# Patient Record
Sex: Female | Born: 1995 | Race: White | Hispanic: No | Marital: Single | Smoking: Never smoker
Health system: Southern US, Community
[De-identification: ages and names within clinical notes are randomized; demographics above are authoritative.]

## PROBLEM LIST (undated history)

## (undated) DIAGNOSIS — N920 Excessive and frequent menstruation with regular cycle: Secondary | ICD-10-CM

## (undated) DIAGNOSIS — G43909 Migraine, unspecified, not intractable, without status migrainosus: Secondary | ICD-10-CM

## (undated) DIAGNOSIS — R519 Headache, unspecified: Secondary | ICD-10-CM

## (undated) HISTORY — DX: Excessive and frequent menstruation with regular cycle: N92.0

## (undated) HISTORY — DX: Headache, unspecified: R51.9

## (undated) HISTORY — PX: EYE SURGERY: SHX253

## (undated) HISTORY — PX: OTHER SURGICAL HISTORY: SHX169

## (undated) HISTORY — DX: Migraine, unspecified, not intractable, without status migrainosus: G43.909

---
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
## 2016-06-08 IMAGING — MR MR WRIST*L* W/O CM
4 of 6 series · 13 of 40 positions shown · non-contrast
Comparison: None.

CLINICAL DATA: Left wrist pain and popping since an injury
approximately 1 year ago while performing karate.

EXAM:
MR OF THE LEFT WRIST WITHOUT CONTRAST
TECHNIQUE: Multiplanar, multisequence MR imaging of the left wrist was
performed. No intravenous contrast was administered.

[Series 3: t2fs axial · axial · 3.0mm · 0.14mm/px · z∈[-64,-13]mm · 3 of 24 slices shown]
[im 4/24]
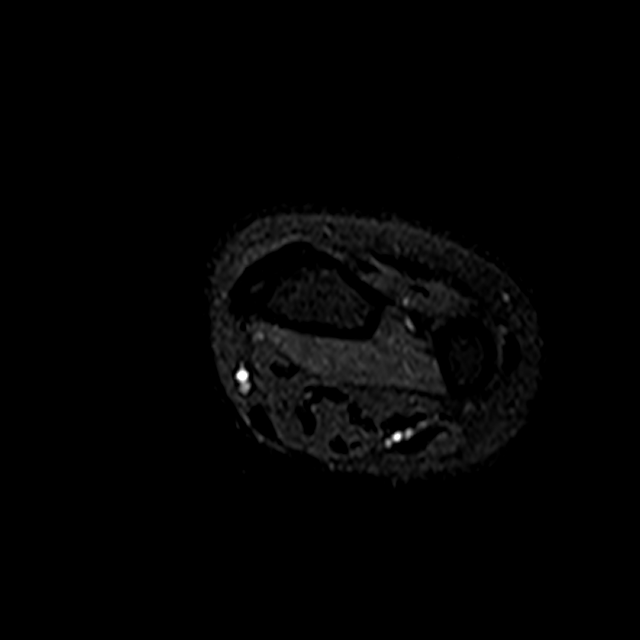
[im 12/24]
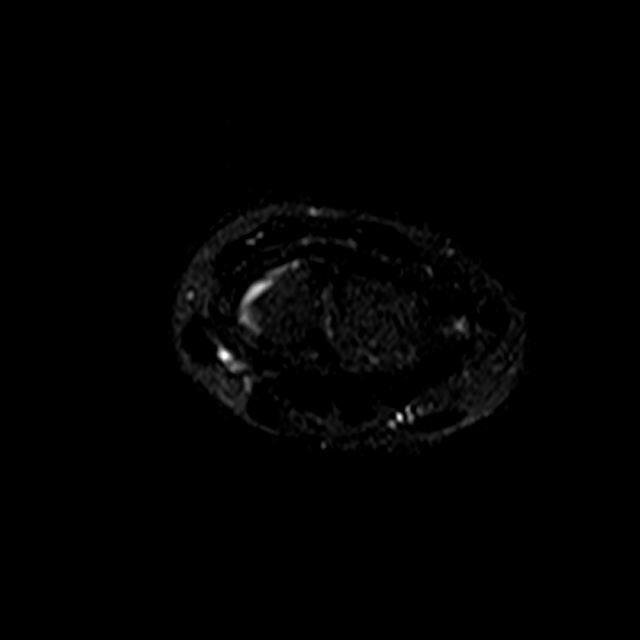
[im 20/24]
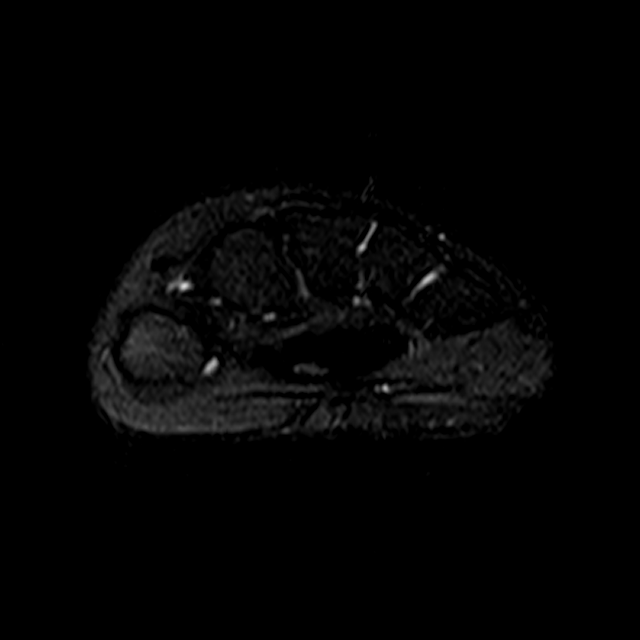

[Series 4: T1 · coronal · 3.0mm · 0.14mm/px · 4 of 16 slices shown]
[im 1/16]
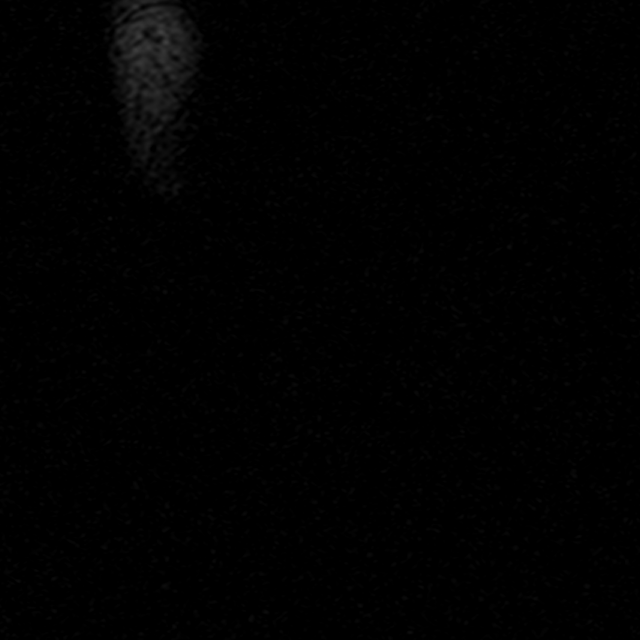
[im 4/16]
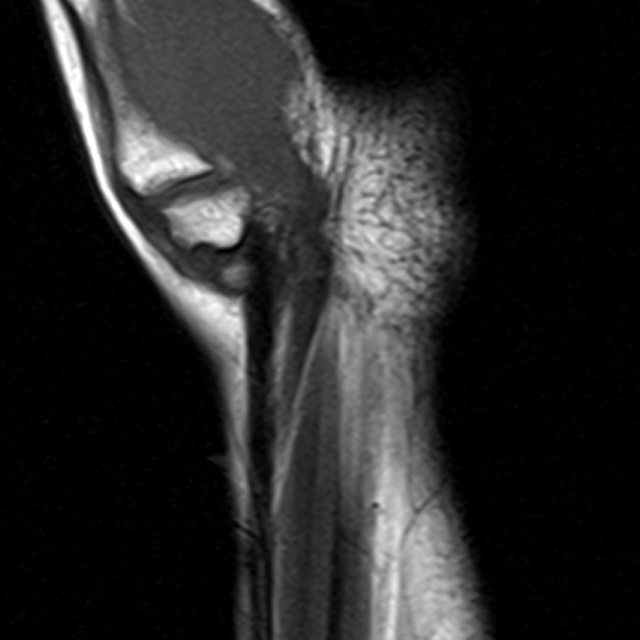
[im 10/16]
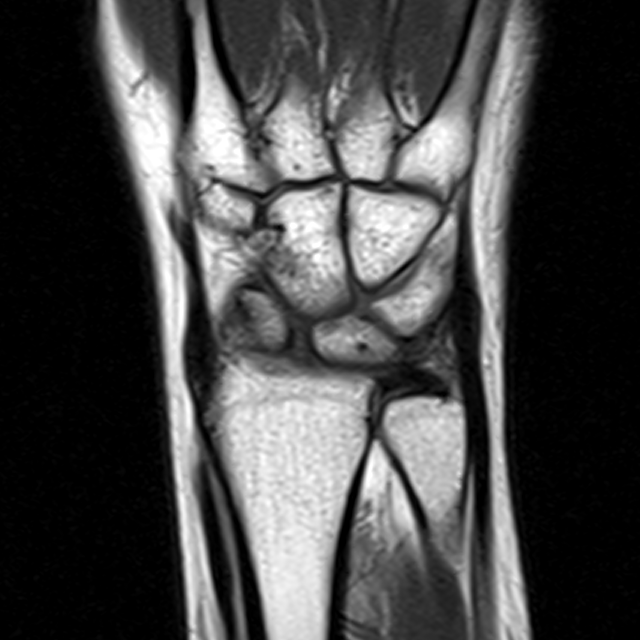
[im 16/16]
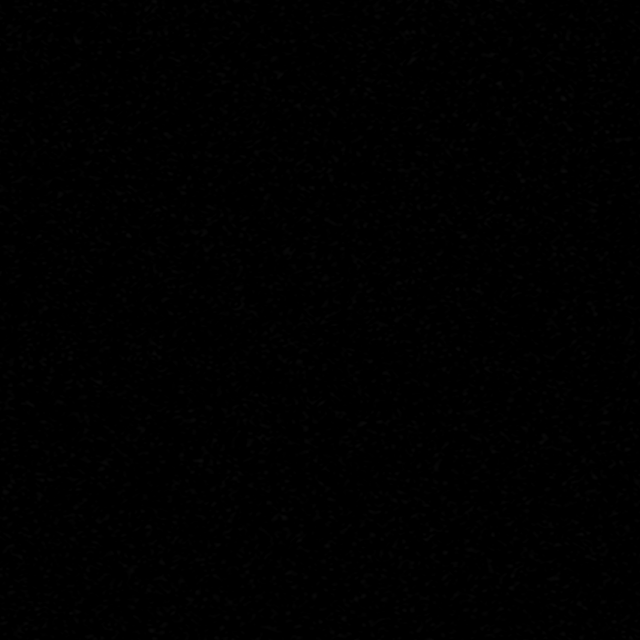

[Series 5: t2fs coronal · coronal · 3.0mm · 0.14mm/px · 3 of 16 slices shown]
[im 4/16]
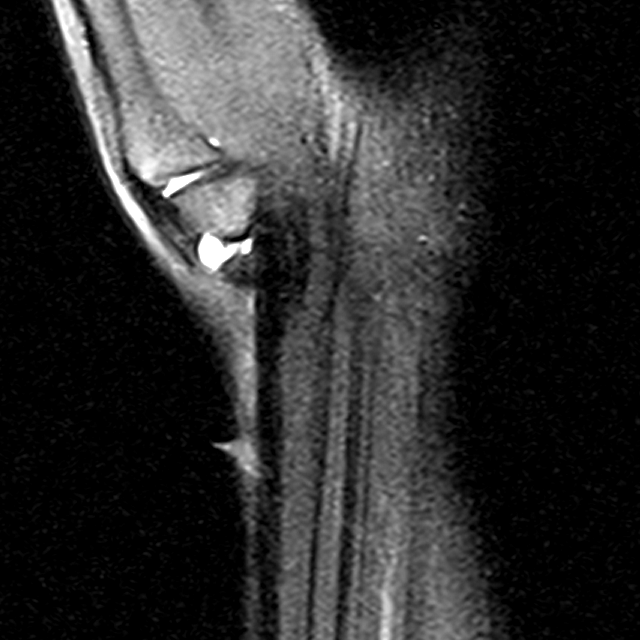
[im 10/16]
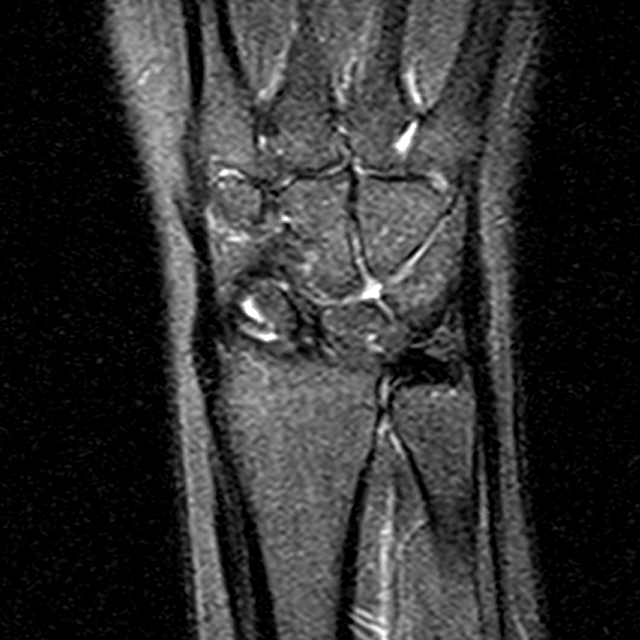
[im 16/16]
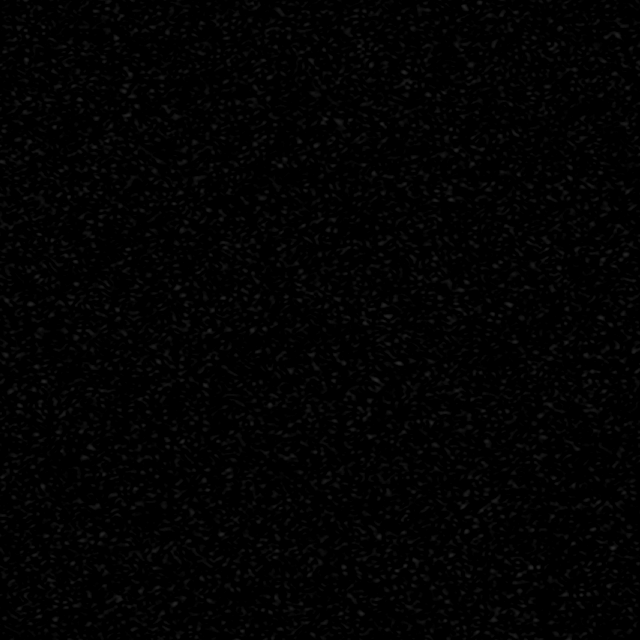

[Series 6: pdfs coronal · coronal · 3.0mm · 0.14mm/px · 3 of 16 slices shown]
[im 4/16]
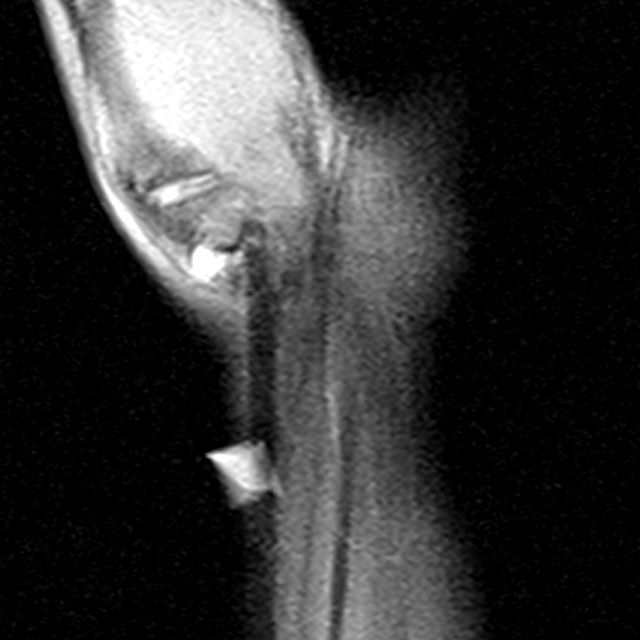
[im 10/16]
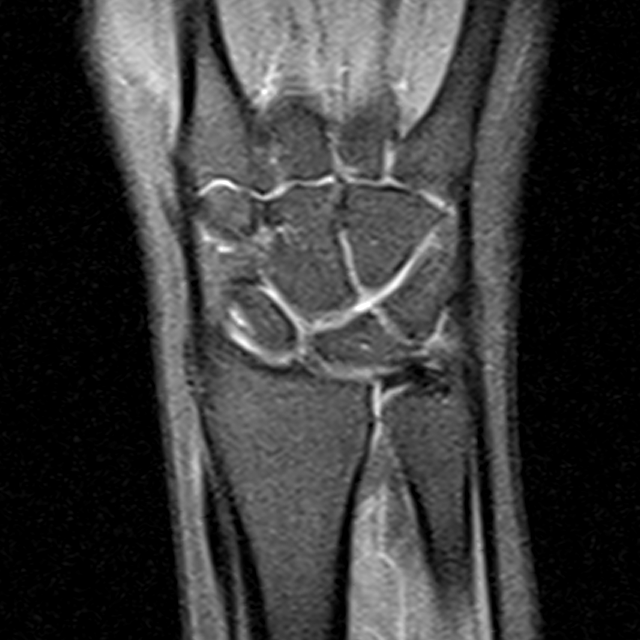
[im 16/16]
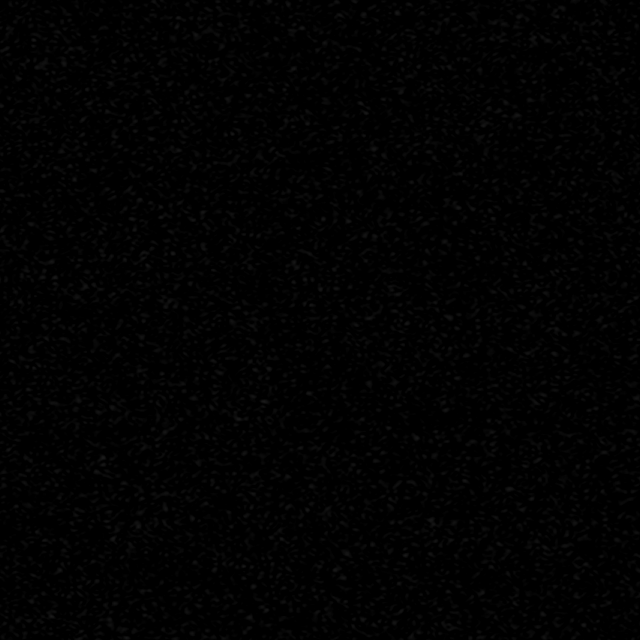

[13 of 40 positions shown; findings below may reference images not displayed]

FINDINGS: Ligaments: The scapholunate and lunotriquetral ligaments are intact
as are the other visualized ligaments.

Triangular fibrocartilage: Normal.

Tendons: Normal. Specifically, the flexor carpi radialis tendon
appears normal. This tendon is at the site of the patient's reported
pain. The median nerve lies immediately deep to the tendon at that
site and has normal signal intensity.

Carpal tunnel/median nerve: Normal.

Guyon's canal: Normal.

Joint/cartilage: Normal.  No joint effusions.

Bones/carpal alignment: No significant osseous abnormalities.

Other: There is a small ganglion cyst associated at the volar aspect
of articulation between the scaphoid and trapezium. This measures 6
x 5 x 3 mm.
IMPRESSION: 1. No abnormality at the site of the patient's pain.
2. Small ganglion cyst at the volar aspect of the articulation
between the scaphoid and trapezium.
3. Otherwise, normal exam.

## 2017-05-22 ENCOUNTER — Emergency Department (HOSPITAL_COMMUNITY)
Admission: EM | Admit: 2017-05-22 | Discharge: 2017-05-22 | Disposition: A | Discharge status: Home / Self Care | Payer: BLUE CROSS/BLUE SHIELD | Attending: Emergency Medicine | Admitting: Emergency Medicine

## 2017-05-22 ENCOUNTER — Other Ambulatory Visit: Payer: Self-pay

## 2017-05-22 ENCOUNTER — Encounter (HOSPITAL_COMMUNITY): Payer: Self-pay | Admitting: Emergency Medicine

## 2017-05-22 DIAGNOSIS — G43009 Migraine without aura, not intractable, without status migrainosus: Secondary | ICD-10-CM | POA: Insufficient documentation

## 2017-05-22 DIAGNOSIS — R51 Headache: Secondary | ICD-10-CM | POA: Diagnosis not present

## 2017-05-22 HISTORY — DX: Migraine, unspecified, not intractable, without status migrainosus: G43.909

## 2017-05-22 MED ORDER — DIPHENHYDRAMINE HCL 50 MG/ML IJ SOLN
12.5000 mg | Freq: Once | INTRAMUSCULAR | Status: AC
  Administered 2017-05-22: 12.5 mg via INTRAVENOUS
  Filled 2017-05-22: qty 1

## 2017-05-22 MED ORDER — METOCLOPRAMIDE HCL 5 MG/ML IJ SOLN
10.0000 mg | Freq: Once | INTRAMUSCULAR | Status: AC
  Administered 2017-05-22: 10 mg via INTRAVENOUS
  Filled 2017-05-22: qty 2

## 2017-05-22 MED ORDER — KETOROLAC TROMETHAMINE 30 MG/ML IJ SOLN
30.0000 mg | Freq: Once | INTRAMUSCULAR | Status: AC
  Administered 2017-05-22: 30 mg via INTRAVENOUS
  Filled 2017-05-22: qty 1

## 2017-05-22 MED ORDER — SODIUM CHLORIDE 0.9 % IV BOLUS (SEPSIS)
1000.0000 mL | Freq: Once | INTRAVENOUS | Status: AC
  Administered 2017-05-22: 1000 mL via INTRAVENOUS

## 2017-05-22 NOTE — Discharge Instructions (Signed)
Return if any problems. See the Neurologist for evaluation

## 2017-05-22 NOTE — ED Triage Notes (Signed)
Patient complains of headache. States vomiting and nausea that started when she woke up this morning. She describes the headache as pounding. She states that sound and light make the pain worse. She states nothing makes it better. She says she has vomited 4 times today. She denies abdominal pain.

## 2017-05-22 NOTE — ED Notes (Signed)
Patient states that her pain is zero. Father voiced concern that she will have pain when she moves and this student nurse advised that if pt. Experiences pain at that time she should verbalize on a pain scale.

## 2017-05-22 NOTE — ED Provider Notes (Signed)
[facility] EMERGENCY DEPARTMENT Provider Note   CSN: [id number] Arrival date & time: 05/22/17  1506     History   Chief Complaint Chief Complaint  Patient presents with  . Migraine    HPI Tracy Edwards is a 22 y.o. female.  The history is provided by the patient. No language interpreter was used.  Migraine  This is a new problem. The current episode started more than 2 days ago. The problem occurs every several days. The problem has been gradually worsening. Associated symptoms include headaches. Nothing aggravates the symptoms. Nothing relieves the symptoms. She has tried acetaminophen for the symptoms. The treatment provided no relief.  Pt has headaches associated with her periods.  No current MD.  Pt takes excedrin without relief.    Past Medical History:  Diagnosis Date  . Migraines     There are no active problems to display for this patient.   Past Surgical History:  Procedure Laterality Date  . EYE SURGERY     lazy eye  . wisdom teeth      OB History    No data available       Home Medications    Prior to Admission medications   Not on File    Family History No family history on file.  Social History Social History   Tobacco Use  . Smoking status: Never Smoker  . Smokeless tobacco: Never Used  Substance Use Topics  . Alcohol use: No    Frequency: Never  . Drug use: No     Allergies   Patient has no known allergies.   Review of Systems Review of Systems  Neurological: Positive for headaches.  All other systems reviewed and are negative.    Physical Exam Updated Vital Signs BP (!) 142/82 (BP Location: Left Arm)   Pulse (!) 101   Temp 98.8 F (37.1 C) (Oral)   Resp 16   Ht 5\' 2"  (1.575 m)   Wt 43.1 kg (95 lb)   LMP 05/22/2017 (Exact Date)   SpO2 100%   BMI 17.38 kg/m   Physical Exam  Constitutional: She appears well-developed and well-nourished. No distress.  HENT:  Head: Normocephalic and atraumatic.  Right Ear:  External ear normal.  Left Ear: External ear normal.  Mouth/Throat: Oropharynx is clear and moist.  Eyes: Conjunctivae and EOM are normal. Pupils are equal, round, and reactive to light.  Neck: Normal range of motion. Neck supple.  Cardiovascular: Normal rate and regular rhythm.  No murmur heard. Pulmonary/Chest: Effort normal and breath sounds normal. No respiratory distress.  Abdominal: Soft. There is no tenderness.  Musculoskeletal: Normal range of motion.  Neurological: She is alert.  Skin: Skin is warm and dry.  Psychiatric: She has a normal mood and affect.  Nursing note and vitals reviewed.    ED Treatments / Results  Labs (all labs ordered are listed, but only abnormal results are displayed) Labs Reviewed - No data to display  EKG  EKG Interpretation None       Radiology No results found.  Procedures Procedures (including critical care time)  Medications Ordered in ED Medications  sodium chloride 0.9 % bolus 1,000 mL (1,000 mLs Intravenous New Bag/Given 05/22/17 1707)  metoCLOPramide (REGLAN) injection 10 mg (10 mg Intravenous Given 05/22/17 1709)  diphenhydrAMINE (BENADRYL) injection 12.5 mg (12.5 mg Intravenous Given 05/22/17 1711)  ketorolac (TORADOL) 30 MG/ML injection 30 mg (30 mg Intravenous Given 05/22/17 1707)     Initial Impression / Assessment and Plan /  ED Course  I have reviewed the triage vital signs and the nursing notes.  Pertinent labs & imaging results that were available during my care of the patient were reviewed by me and considered in my medical decision making (see chart for details).     MDM   Pt given Iv fluids x 1 liter,  Oxygen 2 liters, Reglan, Torodol and benadryl.   Pt reports complete pain relief.   I will give number for Neurologist for follow up.   Final Clinical Impressions(s) / ED Diagnoses   Final diagnoses:  Migraine without aura and without status migrainosus, not intractable    ED Discharge Orders    None    An After  Visit Summary was printed and given to the patient.    Elson AreasSofia, Irisa Grimsley K, [place] JerseyPA-C 05/22/17 1756    Eber HongMiller, Brian, MD 05/23/17 1455

## 2017-06-16 DIAGNOSIS — Z0000 Encounter for general adult medical examination without abnormal findings: Secondary | ICD-10-CM | POA: Diagnosis not present

## 2017-07-27 DIAGNOSIS — N920 Excessive and frequent menstruation with regular cycle: Secondary | ICD-10-CM | POA: Diagnosis not present

## 2017-07-27 DIAGNOSIS — Z0000 Encounter for general adult medical examination without abnormal findings: Secondary | ICD-10-CM | POA: Diagnosis not present

## 2017-07-27 DIAGNOSIS — G43909 Migraine, unspecified, not intractable, without status migrainosus: Secondary | ICD-10-CM | POA: Diagnosis not present

## 2017-07-27 LAB — LIPID PANEL
Cholesterol: 194 (ref 0–200)
HDL: 58 (ref 35–70)
LDL Cholesterol: 114
Triglycerides: 109 (ref 40–160)

## 2017-07-27 LAB — COMPREHENSIVE METABOLIC PANEL
Albumin: 4.3 (ref 3.5–5.0)
Calcium: 9.5 (ref 8.7–10.7)
GFR calc Af Amer: 145
GFR calc non Af Amer: 125
Globulin: 3.1

## 2017-07-27 LAB — BASIC METABOLIC PANEL
BUN: 10 (ref 4–21)
CO2: 24 — AB (ref 13–22)
Chloride: 104 (ref 99–108)
Creatinine: 0.7 (ref <=1.1)
Glucose: 81
Potassium: 4.7 (ref 3.4–5.3)
Sodium: 137 (ref 137–147)

## 2017-07-27 LAB — CBC AND DIFFERENTIAL
HCT: 38 (ref 36–46)
Hemoglobin: 12.8 (ref 12.0–16.0)
Platelets: 343 (ref 150–399)
WBC: 9.3

## 2017-07-27 LAB — HEPATIC FUNCTION PANEL
ALT: 7 (ref 7–35)
AST: 15 (ref 13–35)
Alkaline Phosphatase: 55 (ref 25–125)

## 2017-07-27 LAB — CBC: RBC: 4.64 (ref 3.87–5.11)

## 2017-07-27 LAB — TSH: TSH: 3.37 (ref <=5.90)

## 2017-09-15 DIAGNOSIS — N920 Excessive and frequent menstruation with regular cycle: Secondary | ICD-10-CM | POA: Diagnosis not present

## 2017-09-15 DIAGNOSIS — G43909 Migraine, unspecified, not intractable, without status migrainosus: Secondary | ICD-10-CM | POA: Diagnosis not present

## 2017-10-29 DIAGNOSIS — S0006XA Insect bite (nonvenomous) of scalp, initial encounter: Secondary | ICD-10-CM | POA: Diagnosis not present

## 2017-10-29 DIAGNOSIS — L0390 Cellulitis, unspecified: Secondary | ICD-10-CM | POA: Diagnosis not present

## 2017-12-22 DIAGNOSIS — N920 Excessive and frequent menstruation with regular cycle: Secondary | ICD-10-CM | POA: Diagnosis not present

## 2017-12-22 DIAGNOSIS — G43909 Migraine, unspecified, not intractable, without status migrainosus: Secondary | ICD-10-CM | POA: Diagnosis not present

## 2018-09-16 ENCOUNTER — Ambulatory Visit
Admission: EM | Admit: 2018-09-16 | Discharge: 2018-09-16 | Disposition: A | Discharge status: Home / Self Care | Payer: BC Managed Care – PPO

## 2018-09-16 ENCOUNTER — Other Ambulatory Visit: Payer: Self-pay

## 2018-09-16 DIAGNOSIS — H66002 Acute suppurative otitis media without spontaneous rupture of ear drum, left ear: Secondary | ICD-10-CM | POA: Diagnosis not present

## 2018-09-16 MED ORDER — AMOXICILLIN-POT CLAVULANATE 875-125 MG PO TABS: 1.0000 | ORAL_TABLET | Freq: Two times a day (BID) | ORAL | 0 refills | Status: AC

## 2018-09-16 NOTE — ED Provider Notes (Signed)
[place]   [id number] 09/16/18 Arrival Time: [place]: History from: patient.  Tracy Edwards is a 23 y.o. female who presents with of left ear pain x 2 days.  Denies a precipitating event, such as swimming or wearing ear plugs.  Patient states the pain is constant and achy in character.  Patient has tried OTC ear drops without relief.  Symptoms are made worse with lying down.  Reports similar symptoms in the past.   Complains of muffled hearing right ear. Denies fever, chills, fatigue, sinus pain, rhinorrhea, ear discharge, sore throat, SOB, wheezing, chest pain, nausea, changes in bowel or bladder habits.    ROS: As per HPI.  Past Medical History:  Diagnosis Date  . Migraines    Past Surgical History:  Procedure Laterality Date  . EYE SURGERY     lazy eye  . wisdom teeth     No Known Allergies No current facility-administered medications on file prior to encounter.    Current Outpatient Medications on File Prior to Encounter  Medication Sig Dispense Refill  . rizatriptan (MAXALT) 10 MG tablet Take 10 mg by mouth as needed for migraine. May repeat in 2 hours if needed     Social History   Socioeconomic History  . Marital status: Single    Spouse name: Not on file  . Number of children: Not on file  . Years of education: Not on file  . Highest education level: Not on file  Occupational History  . Not on file  Social Needs  . Financial resource strain: Not on file  . Food insecurity    Worry: Not on file    Inability: Not on file  . Transportation needs    Medical: Not on file    Non-medical: Not on file  Tobacco Use  . Smoking status: Never Smoker  . Smokeless tobacco: Never Used  Substance and Sexual Activity  . Alcohol use: No    Frequency: Never  . Drug use: No  . Sexual activity: Not on file  Lifestyle  . Physical activity    Days per week: Not on file    Minutes per session: Not on file  . Stress: Not on file   Relationships  . Social Herbalist on phone: Not on file    Gets together: Not on file    Attends religious service: Not on file    Active member of club or organization: Not on file    Attends meetings of clubs or organizations: Not on file    Relationship status: Not on file  . Intimate partner violence    Fear of current or ex partner: Not on file    Emotionally abused: Not on file    Physically abused: Not on file    Forced sexual activity: Not on file  Other Topics Concern  . Not on file  Social History Narrative  . Not on file   History reviewed. No pertinent family history.  OBJECTIVE:  Vitals:   09/16/18 1041  BP: (!) 149/98  Pulse: 71  Resp: 14  Temp: 98.9 F (37.2 C)  TempSrc: Oral  SpO2: 99%     General appearance: alert; well-appearing HEENT: NCAT; Ears: RT EAC clear, RT TM pearly gray, LT EAC clear, LT TM appears tense and mildly erythematous, mild erythema over mastoid, but no mastoid tenderness; Eyes: PERRL.  EOM grossly intact.  Sinuses nontender; Nose: nares patent without rhinorrhea; tonsils nonerythematous, uvula  midline  Neck: supple without LAD Lungs: unlabored respirations, symmetrical air entry; cough: absent; no respiratory distress Heart: regular rate and rhythm.  Radial pulses 2+ symmetrical bilaterally Skin: warm and dry Psychological: alert and cooperative; normal mood and affect   ASSESSMENT & PLAN:  1. Non-recurrent acute suppurative otitis media of left ear without spontaneous rupture of tympanic membrane     Meds ordered this encounter  Medications  . amoxicillin-clavulanate (AUGMENTIN) 875-125 MG tablet    Sig: Take 1 tablet by mouth every 12 (twelve) hours for 10 days.    Dispense:  20 tablet    Refill:  0    Order Specific Question:   Supervising Provider    Answer:   Eustace MooreELSON, YVONNE SUE [[id number]]    Rest and drink plenty of fluids Prescribed augmentin 875 mg for 10 days Take medications as directed and to  completion Continue to use OTC ibuprofen and/ or tylenol as needed for pain control Follow up with PCP if symptoms persists Return here or go to the ER if you have any new or worsening symptoms fever, chills, fatigue, ear discharge, increased pain or redness behind the ear, sore throat, SOB, wheezing, chest pain, nausea, symtpoms do not improved despite medications, etc...    Reviewed expectations re: course of current medical issues. Questions answered. Outlined signs and symptoms indicating need for more acute intervention. Patient verbalized understanding. After Visit Summary given.         Rennis HardingWurst, Burnis Kaser, PA-C 09/16/18 1321

## 2018-09-16 NOTE — Discharge Instructions (Signed)
Rest and drink plenty of fluids Prescribed augmentin 875 mg for 10 days Take medications as directed and to completion Continue to use OTC ibuprofen and/ or tylenol as needed for pain control Follow up with PCP if symptoms persists Return here or go to the ER if you have any new or worsening symptoms fever, chills, fatigue, ear discharge, increased pain or redness behind the ear, sore throat, SOB, wheezing, chest pain, nausea, symtpoms do not improved despite medications, etc..Marland Kitchen

## 2018-09-16 NOTE — ED Triage Notes (Signed)
Pt has c/o left ear pain that began last week, has used debrox at home with some relief

## 2019-02-19 DIAGNOSIS — N920 Excessive and frequent menstruation with regular cycle: Secondary | ICD-10-CM | POA: Insufficient documentation

## 2019-02-19 DIAGNOSIS — R519 Headache, unspecified: Secondary | ICD-10-CM | POA: Insufficient documentation

## 2019-02-19 DIAGNOSIS — G43809 Other migraine, not intractable, without status migrainosus: Secondary | ICD-10-CM

## 2019-02-19 DIAGNOSIS — G43909 Migraine, unspecified, not intractable, without status migrainosus: Secondary | ICD-10-CM | POA: Insufficient documentation

## 2019-03-08 ENCOUNTER — Other Ambulatory Visit: Payer: Self-pay

## 2019-03-08 ENCOUNTER — Encounter: Payer: Self-pay | Admitting: Family Medicine

## 2019-03-08 ENCOUNTER — Ambulatory Visit: Payer: BC Managed Care – PPO | Admitting: Family Medicine

## 2019-03-08 VITALS — BP 90/70 | HR 75 | Temp 98.3°F | Ht 63.0 in | Wt 114.8 lb

## 2019-03-08 DIAGNOSIS — G43809 Other migraine, not intractable, without status migrainosus: Secondary | ICD-10-CM

## 2019-03-08 MED ORDER — RIZATRIPTAN BENZOATE 10 MG PO TBDP: 10.0000 mg | ORAL_TABLET | Freq: Every day | ORAL | 5 refills | Status: DC | PRN

## 2019-03-08 NOTE — Progress Notes (Signed)
New Patient Office Visit  Subjective:  Patient ID: Tracy Edwards, female    DOB: [birth year]  Age: 23 y.o. MRN: [id number]  CC:  Chief Complaint  Patient presents with  . Establish Care  . Medication Refill    Rizatriptan  migraines   HPI BRENT NOTO presents for headaches-noted with training and during weeks of periods. Pt states onset upon waking.  Pt admits to not adequate hydration.  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 Pt states she does not drink enough water. Unknown trigger.  Uses Maxalt MLT.  Long term concern.  Pt states average use max 2/week.  Pt states if headaches are minor she uses Goody's Powder. No v/n associated. Evaluation by neuro in the past.  Past Medical History:  Diagnosis Date  . Excessive and frequent menstruation with regular cycle   . Headache   . Migraine, unspecified, not intractable, without status migrainosus   . Migraines     Past Surgical History:  Procedure Laterality Date  . EYE SURGERY     lazy eye  . wisdom teeth      Family History  Problem Relation Age of Onset  . Cancer Mother   . Hyperlipidemia Mother   . Hypertension Mother   . Hypertension Father   . Hyperlipidemia Father     Social History   Socioeconomic History  . Marital status: Single    Spouse name: Not on file  . Number of children: Not on file  . Years of education: Not on file  . Highest education level: Not on file  Occupational History  . Not on file  Tobacco Use  . Smoking status: Never Smoker  . Smokeless tobacco: Never Used  Substance and Sexual Activity  . Alcohol use: No  . Drug use: No  . Sexual activity: Not Currently  Other Topics Concern  . Not on file  Social History Narrative  . Not on file   Social Determinants of Health   Financial Resource Strain:   . Difficulty of Paying Living Expenses: Not on file  Food Insecurity:   . Worried About Programme researcher, broadcasting/film/video in the Last Year: Not on file  . Ran Out of Food in the Last Year: Not  on file  Transportation Needs:   . Lack of Transportation (Medical): Not on file  . Lack of Transportation (Non-Medical): Not on file  Physical Activity:   . Days of Exercise per Week: Not on file  . Minutes of Exercise per Session: Not on file  Stress:   . Feeling of Stress : Not on file  Social Connections:   . Frequency of Communication with Friends and Family: Not on file  . Frequency of Social Gatherings with Friends and Family: Not on file  . Attends Religious Services: Not on file  . Active Member of Clubs or Organizations: Not on file  . Attends Banker Meetings: Not on file  . Marital Status: Not on file  Intimate Partner Violence:   . Fear of Current or Ex-Partner: Not on file  . Emotionally Abused: Not on file  . Physically Abused: Not on file  . Sexually Abused: Not on file    ROS Review of Systems  Constitutional: Negative.   Eyes: Negative.   Respiratory: Negative.   Allergic/Immunologic: Negative for environmental allergies.  Neurological: Positive for headaches. Negative for dizziness and seizures.  Psychiatric/Behavioral: Negative.     Objective:   Today's Vitals: BP 90/70 (BP Location: Left Arm,  Patient Position: Sitting, Cuff Size: Normal)   Pulse 75   Temp 98.3 F (36.8 C) (Oral)   Ht 5\' 3"  (1.6 m)   Wt 114 lb 12.8 oz (52.1 kg)   SpO2 99%   BMI 20.34 kg/m   Physical Exam Constitutional:      Appearance: Normal appearance.  Cardiovascular:     Rate and Rhythm: Normal rate and regular rhythm.     Pulses: Normal pulses.     Heart sounds: Normal heart sounds.  Neurological:     Mental Status: She is alert.  Psychiatric:        Mood and Affect: Mood normal.        Behavior: Behavior normal.     Assessment & Plan:   1. Other migraine without status migrainosus, not intractable-rx maxalt  Outpatient Encounter Medications as of 03/08/2019  Medication Sig  . [DISCONTINUED] rizatriptan (MAXALT) 10 MG tablet Take 10 mg by mouth  as needed for migraine. May repeat in 2 hours if needed  . rizatriptan (MAXALT-MLT) 10 MG disintegrating tablet Take 10 mg by mouth daily as needed.  Valaria Good FE 0.4-35 MG-MCG tablet Chew 1 tablet by mouth daily.  . [DISCONTINUED] SUMAtriptan (IMITREX) 100 MG tablet Take 100 mg by mouth every 2 (two) hours as needed for migraine. May repeat in 2 hours if headache persists or recurs.   No facility-administered encounter medications on file as of 03/08/2019.  greater than 50 percent of office visit spent in discussion about migraines, hydration, regular meals, avoid low blood pressure,calorie replacement following exercise.  Pt will use Mom's bp cuff to monitor pulse rate and blood pressure.    Follow-up: pap smear  Charlesa Ehle Hannah Beat, MD

## 2019-03-08 NOTE — Patient Instructions (Signed)
Hydration with exercise Adequate nutrition Check blood pressure

## 2019-03-13 ENCOUNTER — Telehealth: Payer: Self-pay

## 2019-03-13 DIAGNOSIS — G43809 Other migraine, not intractable, without status migrainosus: Secondary | ICD-10-CM

## 2019-03-13 MED ORDER — RIZATRIPTAN BENZOATE 10 MG PO TBDP: 10.0000 mg | ORAL_TABLET | Freq: Every day | ORAL | 5 refills | Status: DC | PRN

## 2019-03-13 NOTE — Telephone Encounter (Signed)
Tracy Edwards, CMA  

## 2019-04-24 ENCOUNTER — Ambulatory Visit: Payer: BC Managed Care – PPO | Admitting: Family Medicine

## 2019-04-24 ENCOUNTER — Other Ambulatory Visit: Payer: Self-pay

## 2019-04-24 ENCOUNTER — Encounter: Payer: Self-pay | Admitting: Family Medicine

## 2019-04-24 DIAGNOSIS — G43809 Other migraine, not intractable, without status migrainosus: Secondary | ICD-10-CM | POA: Diagnosis not present

## 2019-04-24 MED ORDER — RIZATRIPTAN BENZOATE 10 MG PO TBDP: 10.0000 mg | ORAL_TABLET | Freq: Every day | ORAL | 5 refills | Status: AC | PRN

## 2019-04-24 NOTE — Patient Instructions (Signed)
No pap smear due to size of vaginal opening. Call if concerns in the future for referral to gyn for additional evaluation

## 2019-04-24 NOTE — Progress Notes (Signed)
2/8/20212:57 PM  Tracy Edwards 1995-07-20, 24 y.o., female [id number]  Chief Complaint  Patient presents with  . Annual Exam    pap smear    HPI: WELL WOMAN EXAM  LMP: 03/2019-normal CHANGES IN PERIODS:monthly NUMBER OF PREGNANCIES:G0 CONTRACEPTION/HRT-BCP-1 year-heavy periods caused onset of BCP-pt is not sexually active LAST MAMMOGRAM/Ultrasound: no breast masses GYN SURGERIES: No cysts  Depression screen PHQ 2/9 03/08/2019  Decreased Interest 0  Down, Depressed, Hopeless 0  PHQ - 2 Score 0    No Known Allergies  Prior to Admission medications   Medication Sig Start Date End Date Taking? Authorizing Provider  rizatriptan (MAXALT-MLT) 10 MG disintegrating tablet Take 1 tablet (10 mg total) by mouth daily as needed. 03/13/19   Maryruth Hancock, MD  [place] FE 0.4-35 MG-MCG tablet Chew 1 tablet by mouth daily. 09/06/18   [provider]    Past Medical History:  Diagnosis Date  . Excessive and frequent menstruation with regular cycle   . Headache   . Migraine, unspecified, not intractable, without status migrainosus   . Migraines     Past Surgical History:  Procedure Laterality Date  . EYE SURGERY     lazy eye  . wisdom teeth      Social History   Tobacco Use  . Smoking status: Never Smoker  . Smokeless tobacco: Never Used  Substance Use Topics  . Alcohol use: No    Social History   Social History Narrative  . Not on file    Family History  Problem Relation Age of Onset  . Cancer Mother   . Hyperlipidemia Mother   . Hypertension Mother   . Hypertension Father   . Hyperlipidemia Father     ROS: Constitutional: no unexplained weight loss/gain  HEENT: no difficulty with hearing, sinus problems, runny nose, post nasal drip, ringing in the ears, mouth sores, loose teeth, ear pain, nosebleeds, sore throat, facial pain or numbness, no visual changes, no itching or drainage of the eyes Breast: no nipple discharge or breast lumps palpable CV:  no irregular heartbeat, chest pain, swelling of the feet or legs, leg pain with walking  RESP: no short of breath, night sweats, prolonged cough, wheezing, sputum production, oxygen at home, coughing up blood, history of an abnormal chest xray GI: no heartburn, constipation, diarrhea, nausea, vomiting, blood in stool, changes in bowels GU: no painful urination, frequent of urination, urgency, bladder problems MS: no joint pain, swelling in the joints, joint deformity, back pain Skin:no persistent rash, new skin lesions, no hair loss Neuro: no headaches, double or blurred vision, weakness, dizziness or LOC PSY: no insomnia, depression, anxiety, mood swings Endo: no intolerance to heat or cold, no increase in thirst Heme: no easy brusing, anemia, swollen lymph nodes Allergy: no seasonal allergies, hay fever  Today's Vitals   04/24/19 1435  BP: (!) 79/60  Pulse: 60  Temp: 98.4 F (36.9 C)  TempSrc: Oral  SpO2: 96%  Weight: 112 lb 9.6 oz (51.1 kg)  Height: 5\' 3"  (1.6 m)   Body mass index is 19.95 kg/m.   EXAM:  Constitutional:  Lungs: clear breath sounds to auscultation Heart: no lifts or heaves , normal S1/2 with no murmur on auscultation Abdomen: no scars or striae, normal bowel sounds, no tenderness to palpation, liver and spleen non palpable Back: no CVA tenderness Ext: normal ROM Lymph nodes-anterior and posterior cervical not palpable Neuro: awake and alert, oriented to person, place and time, normal gait and balance GYN: normal  labia, clitoris, urethral orifice and introitus,  Cervix-not able to visualize-pt not sexually active and smallest speculum could not be inserted Vagina-not visualized Bimanual exam: uterus is anterior, midline, not enlarged, adnexa non palpable Anus no fissure or hemorrhoids (pt only able to use slender tampons) no h/o of sexual activity  ASSESSMENT/PLAN: Normal well female exam-unable to complete pap smear. Pt not currently sexually active.  Continue BCP for cycle regulation.  Pt and Mom did not desire referral to GYN for evaluation. No current concerns-low risk for cervical abnormalities   Dorette Grate, MD

## 2019-05-01 ENCOUNTER — Telehealth: Payer: Self-pay | Admitting: Family Medicine

## 2019-05-01 NOTE — Telephone Encounter (Signed)
Patient mother called and said [facility] is out of the chewable tablets for her birth control and is needing the pill called in.  [facility] FE 0.4-35 MG-MCG tablet    [facility] [id number] - [place], [place] - [address] AT [facility] [address] Phone:  [phone]  Fax:  [phone]

## 2019-05-02 NOTE — Telephone Encounter (Signed)
Call pharmacy to see what pills available in same dosage and fill for pt

## 2019-05-02 NOTE — Telephone Encounter (Signed)
Patient would like the pills called into the pharmacy since they are out of the chewable. Patient was last seen on 04/24/19 and next appt is 04/23/20

## 2019-05-03 ENCOUNTER — Other Ambulatory Visit: Payer: Self-pay | Admitting: Emergency Medicine

## 2019-05-03 NOTE — Telephone Encounter (Signed)
Please call in for pt

## 2019-05-03 NOTE — Telephone Encounter (Signed)
Spoke to pharmacy and they stated Zyfemla 0.4-35 is in stock and is equal to the chewable wymzya

## 2019-05-08 ENCOUNTER — Other Ambulatory Visit: Payer: Self-pay | Admitting: Emergency Medicine

## 2019-05-08 DIAGNOSIS — Z3041 Encounter for surveillance of contraceptive pills: Secondary | ICD-10-CM

## 2019-05-08 MED ORDER — VYFEMLA 0.4-35 MG-MCG PO TABS: 1.0000 | ORAL_TABLET | Freq: Every day | ORAL | 11 refills | Status: AC

## 2019-05-08 NOTE — Telephone Encounter (Signed)
Generic vyfemla has been sent into the pharmacy for the patient

## 2019-06-08 DIAGNOSIS — Z23 Encounter for immunization: Secondary | ICD-10-CM | POA: Diagnosis not present

## 2019-07-01 DIAGNOSIS — Z23 Encounter for immunization: Secondary | ICD-10-CM | POA: Diagnosis not present

## 2019-08-23 DIAGNOSIS — Z0189 Encounter for other specified special examinations: Secondary | ICD-10-CM | POA: Diagnosis not present

## 2019-08-23 DIAGNOSIS — G43109 Migraine with aura, not intractable, without status migrainosus: Secondary | ICD-10-CM | POA: Diagnosis not present

## 2020-03-25 ENCOUNTER — Other Ambulatory Visit: Payer: Self-pay | Admitting: Family Medicine

## 2020-03-25 DIAGNOSIS — Z3041 Encounter for surveillance of contraceptive pills: Secondary | ICD-10-CM

## 2020-04-23 ENCOUNTER — Ambulatory Visit: Payer: BC Managed Care – PPO | Admitting: Family Medicine

## 2020-06-05 DIAGNOSIS — Z0001 Encounter for general adult medical examination with abnormal findings: Secondary | ICD-10-CM | POA: Diagnosis not present

## 2020-09-12 DIAGNOSIS — Z23 Encounter for immunization: Secondary | ICD-10-CM | POA: Diagnosis not present

## 2021-01-31 DIAGNOSIS — Z23 Encounter for immunization: Secondary | ICD-10-CM | POA: Diagnosis not present

## 2021-06-04 DIAGNOSIS — R569 Unspecified convulsions: Secondary | ICD-10-CM | POA: Diagnosis not present

## 2021-06-04 DIAGNOSIS — R55 Syncope and collapse: Secondary | ICD-10-CM | POA: Diagnosis not present

## 2021-06-04 DIAGNOSIS — R450 Nervousness: Secondary | ICD-10-CM | POA: Diagnosis not present

## 2021-06-04 DIAGNOSIS — Z0000 Encounter for general adult medical examination without abnormal findings: Secondary | ICD-10-CM | POA: Diagnosis not present

## 2022-03-02 DIAGNOSIS — Z0189 Encounter for other specified special examinations: Secondary | ICD-10-CM | POA: Diagnosis not present
